# Patient Record
Sex: Male | Born: 1955 | Race: White | Hispanic: No | Marital: Single | Smoking: Never smoker
Health system: Southern US, Community
[De-identification: ages and names within clinical notes are randomized; demographics above are authoritative.]

## PROBLEM LIST (undated history)

## (undated) DIAGNOSIS — E7800 Pure hypercholesterolemia, unspecified: Secondary | ICD-10-CM

## (undated) HISTORY — PX: KNEE SURGERY: SHX244

## (undated) HISTORY — PX: HERNIA REPAIR: SHX51

---

## 2016-08-16 DIAGNOSIS — N529 Male erectile dysfunction, unspecified: Secondary | ICD-10-CM | POA: Insufficient documentation

## 2016-08-16 DIAGNOSIS — K648 Other hemorrhoids: Secondary | ICD-10-CM | POA: Insufficient documentation

## 2016-08-16 DIAGNOSIS — N400 Enlarged prostate without lower urinary tract symptoms: Secondary | ICD-10-CM | POA: Insufficient documentation

## 2016-08-16 DIAGNOSIS — J309 Allergic rhinitis, unspecified: Secondary | ICD-10-CM | POA: Insufficient documentation

## 2016-08-16 DIAGNOSIS — I10 Essential (primary) hypertension: Secondary | ICD-10-CM | POA: Insufficient documentation

## 2016-08-16 DIAGNOSIS — K219 Gastro-esophageal reflux disease without esophagitis: Secondary | ICD-10-CM | POA: Insufficient documentation

## 2017-09-26 ENCOUNTER — Emergency Department (HOSPITAL_BASED_OUTPATIENT_CLINIC_OR_DEPARTMENT_OTHER)
Admission: EM | Admit: 2017-09-26 | Discharge: 2017-09-26 | Disposition: A | Discharge status: Home / Self Care | Payer: BLUE CROSS/BLUE SHIELD | Attending: Emergency Medicine | Admitting: Emergency Medicine

## 2017-09-26 ENCOUNTER — Encounter (HOSPITAL_BASED_OUTPATIENT_CLINIC_OR_DEPARTMENT_OTHER): Payer: Self-pay | Admitting: Emergency Medicine

## 2017-09-26 ENCOUNTER — Emergency Department (HOSPITAL_BASED_OUTPATIENT_CLINIC_OR_DEPARTMENT_OTHER): Payer: BLUE CROSS/BLUE SHIELD

## 2017-09-26 DIAGNOSIS — M25512 Pain in left shoulder: Secondary | ICD-10-CM | POA: Diagnosis not present

## 2017-09-26 HISTORY — DX: Pure hypercholesterolemia, unspecified: E78.00

## 2017-09-26 MED ORDER — LIDOCAINE HCL 2 % EX GEL
1.0000 | Freq: Four times a day (QID) | CUTANEOUS | 0 refills | Status: AC | PRN
Start: 2017-09-26 — End: ?

## 2017-09-26 MED ORDER — MELOXICAM 15 MG PO TABS: 15.0000 mg | ORAL_TABLET | Freq: Every day | ORAL | 0 refills | Status: AC

## 2017-09-26 MED ORDER — PREDNISONE 20 MG PO TABS: 40.0000 mg | ORAL_TABLET | Freq: Every day | ORAL | 0 refills | Status: DC

## 2017-09-26 MED ORDER — KETOROLAC TROMETHAMINE 60 MG/2ML IM SOLN
60.0000 mg | Freq: Once | INTRAMUSCULAR | Status: AC
  Administered 2017-09-26: 60 mg via INTRAMUSCULAR
  Filled 2017-09-26: qty 2

## 2017-09-26 MED FILL — MELOXICAM 15 MG TABLET: 15 | 14 days supply | Qty: 14 | Fill #0

## 2017-09-26 MED FILL — predniSONE 20 MG TABS: 20 | 5 days supply | Qty: 10 | Fill #0

## 2017-09-26 MED FILL — LIDOCAINE HCL 2% JELLY: 2 | 10 days supply | Qty: 30 | Fill #0

## 2017-09-26 NOTE — ED Notes (Signed)
Patient states that he woke up yesterday morning with pain to his left shoulder blade and down his left arm. Patient reports that it fells better when he moves it upwards. The patient denies any Numbness or tingling, denies any Chest pain or SOB

## 2017-09-26 NOTE — ED Provider Notes (Signed)
[facility] EMERGENCY DEPARTMENT Provider Note   CSN: [id number] Arrival date & time: 09/26/17  [id number]     History   Chief Complaint Chief Complaint  Patient presents with  . Shoulder Pain    HPI Andrew Garcia is a 61 y.o. male with a history of hypercholesteremia presenting with sudden onset left shoulder pain that began yesterday.  He describes the pain as "feeling like a toothache" that radiates down the left arm to his fingers.  He reports the pain is improved with raising the left arm over his head.  He treated his symptoms with Flexeril last night without improvement. No known trauma or injury.  No previous left shoulder surgeries or injuries.  No left elbow or wrist pain.  He denies neck pain or stiffness, chest pain, shortness of breath, fever, chills, or palpitations.  The history is provided by the patient and the spouse. No language interpreter was used.  Shoulder Pain   This is a new problem. The current episode started yesterday. The problem occurs constantly. The problem has not changed since onset.The pain is present in the left shoulder. The pain is at a severity of 10/10. The pain is severe. Pertinent negatives include no numbness, full range of motion, no stiffness, no tingling and no itching. The symptoms are aggravated by lying down. He has tried OTC pain medications for the symptoms. There has been no history of extremity trauma. Family history is significant for no rheumatoid arthritis and no gout.    Past Medical History:  Diagnosis Date  . High cholesterol     There are no active problems to display for this patient.   History reviewed. No pertinent surgical history.     Home Medications    Prior to Admission medications   Medication Sig Start Date End Date Taking? Authorizing Provider  lidocaine (XYLOCAINE) 2 % jelly Apply 1 application topically 4 (four) times daily as needed. 09/26/17   Demyan Fugate A, PA-C  meloxicam (MOBIC) 15 MG  tablet Take 1 tablet (15 mg total) by mouth daily. 09/26/17   Parker Sawatzky A, PA-C  predniSONE (DELTASONE) 20 MG tablet Take 2 tablets (40 mg total) by mouth daily with breakfast. 09/26/17 10/01/17  Melady Chow, Coral ElseMia A, PA-C    Family History History reviewed. No pertinent family history.  Social History Social History  Substance Use Topics  . Smoking status: Never Smoker  . Smokeless tobacco: Never Used  . Alcohol use Yes     Comment: occ     Allergies   Patient has no known allergies.   Review of Systems Review of Systems  Constitutional: Negative for activity change, chills and fever.  Respiratory: Negative for chest tightness and shortness of breath.   Cardiovascular: Negative for chest pain and palpitations.  Gastrointestinal: Negative for abdominal pain.  Musculoskeletal: Positive for arthralgias, back pain and myalgias. Negative for joint swelling, neck pain, neck stiffness and stiffness.  Skin: Negative for itching and rash.  Neurological: Negative for tingling, weakness and numbness.     Physical Exam Updated Vital Signs BP (!) 140/100 (BP Location: Right Arm)   Pulse 77   Temp 97.8 F (36.6 C) (Oral)   Resp 18   Ht 5\' 9"  (1.753 m)   Wt 108.9 kg (240 lb)   SpO2 99%   BMI 35.44 kg/m   Physical Exam  Constitutional: He appears well-developed.  HENT:  Head: Normocephalic.  Eyes: Conjunctivae are normal.  Neck: Neck supple.  Cardiovascular: Normal rate and regular  rhythm.   No murmur heard. Pulmonary/Chest: Effort normal.  Abdominal: Soft. He exhibits no distension.  Musculoskeletal: Normal range of motion. He exhibits tenderness. He exhibits no deformity.  Tender to palpation along the medial border of the left scapula.  No tenderness to palpation of the superior or inferior border.  Full active and passive range of motion of the left shoulder without difficulty.  Negative empty can test. Negative Apley scratch test. Negative Hawkins test.   Sensation is  intact throughout the bilateral upper extremities.  Radial pulses are 2+ and symmetric.  5 out of 5 grip strength of the bilateral upper extremities.  5 out of 5 strength against resistance of the large muscle groups of the bilateral upper extremities.  No overlying erythema or warmth to the left shoulder or upper back.  Neurological: He is alert.  Skin: Skin is warm and dry.  Psychiatric: His behavior is normal.  Nursing note and vitals reviewed.    ED Treatments / Results  Labs (all labs ordered are listed, but only abnormal results are displayed) Labs Reviewed - No data to display  EKG  EKG Interpretation None       Radiology Dg Shoulder Left  Result Date: 09/26/2017 CLINICAL DATA:  Left shoulder pain.  No known injury. EXAM: LEFT SHOULDER - 2+ VIEW COMPARISON:  None. FINDINGS: There is no evidence of fracture or dislocation. There is no evidence of arthropathy or other focal bone abnormality. Soft tissues are unremarkable. IMPRESSION: Negative. Electronically Signed   By: Charlett Nose M.D.   On: 09/26/2017 09:40    Procedures Procedures (including critical care time)  Medications Ordered in ED Medications  ketorolac (TORADOL) injection 60 mg (60 mg Intramuscular Given 09/26/17 0940)     Initial Impression / Assessment and Plan / ED Course  I have reviewed the triage vital signs and the nursing notes.  Pertinent labs & imaging results that were available during my care of the patient were reviewed by me and considered in my medical decision making (see chart for details).     61 year old male presenting with left shoulder pain that began yesterday. EKG with no acute changes.  Left shoulder x-ray with out acute changes of fracture or dislocation.  No bony lesions.  On physical exam, the patient has tenderness to palpation along the left medial border of the scapula.  Full active and passive range of motion.  NVI.  Doubt rotator cuff tear since negative empty can test,  aortic dissection, gout, or septic joint. we will discharge the patient home with a burst of steroids for acute pain and symptomatic treatment.  Encourage follow-up with his PCP if his symptoms do not improve in the next 5-7 days.  Strict return precautions given.  No acute distress.  The patient is safe for discharge at this time.  Final Clinical Impressions(s) / ED Diagnoses   Final diagnoses:  Acute pain of left shoulder    New Prescriptions Discharge Medication List as of 09/26/2017 10:28 AM    START taking these medications   Details  lidocaine (XYLOCAINE) 2 % jelly Apply 1 application topically 4 (four) times daily as needed., Starting Mon 09/26/2017, Print    meloxicam (MOBIC) 15 MG tablet Take 1 tablet (15 mg total) by mouth daily., Starting Mon 09/26/2017, Print    predniSONE (DELTASONE) 20 MG tablet Take 2 tablets (40 mg total) by mouth daily with breakfast., Starting Mon 09/26/2017, Until Sat 10/01/2017, Print         Dewie Ahart,  Shylee Durrett A, PA-C 09/28/17 [id number]    Alvira Monday, MD 09/29/17 1258

## 2017-09-26 NOTE — Discharge Instructions (Signed)
Take 40 mg (2 tablets) of prednisone daily with breakfast for the next 5 days.  Apply a thin film of lidocaine gel to areas that are sore up to 4 times daily.  Take 1 tablet of meloxicam daily with food to help with pain and inflammation.  Please do not take other anti-inflammatories such as ibuprofen, Motrin, or naproxen while taking this medication.  Begin performing stretching exercises as your pain allows.  If your pain does not improve with this regimen in the next 5 days, please call Dr. Lazaro ArmsHudnall's office to schedule follow-up appointment.  If you develop new symptoms such as weakness in the left arm, chest pain, shortness of breath, or other new concerning symptoms, please return to the emergency department for reevaluation.

## 2017-09-29 ENCOUNTER — Encounter: Payer: Self-pay | Admitting: Family Medicine

## 2017-09-29 ENCOUNTER — Ambulatory Visit (INDEPENDENT_AMBULATORY_CARE_PROVIDER_SITE_OTHER): Payer: BLUE CROSS/BLUE SHIELD | Admitting: Family Medicine

## 2017-09-29 DIAGNOSIS — M5010 Cervical disc disorder with radiculopathy, unspecified cervical region: Secondary | ICD-10-CM

## 2017-09-29 MED ORDER — PREDNISONE 10 MG PO TABS: ORAL_TABLET | ORAL | 0 refills | Status: AC

## 2017-09-29 MED ORDER — HYDROCODONE-ACETAMINOPHEN 5-325 MG PO TABS: 1.0000 | ORAL_TABLET | Freq: Four times a day (QID) | ORAL | 0 refills | Status: AC | PRN

## 2017-09-29 MED ORDER — TIZANIDINE HCL 4 MG PO TABS: 4.0000 mg | ORAL_TABLET | Freq: Three times a day (TID) | ORAL | 1 refills | Status: AC | PRN

## 2017-09-29 MED FILL — predniSONE 10 MG TABS: 10 | 7 days supply | Qty: 21 | Fill #0

## 2017-09-29 MED FILL — HYDROCODON-APAP 5-325: 5-325 | 5 days supply | Qty: 20 | Fill #0

## 2017-09-29 MED FILL — tiZANidine HCL 4 MG TABS: 4 | 20 days supply | Qty: 60 | Fill #0

## 2017-09-29 NOTE — Patient Instructions (Signed)
You have cervical radiculopathy (a pinched nerve in the neck). Prednisone 6 day dose pack to relieve irritation/inflammation of the nerve - finish the prednisone from the ER then start taking this. Don't take meloxicam, aleve or ibuprofen while on prednisone Tizanidine three times a day as needed for muscle spasms (do not drive with this if it makes you sleepy). Norco as needed for severe pain (no driving on this medicine). Consider cervical collar if severely painful. Simple range of motion exercises within limits of pain to prevent further stiffness. Consider physical therapy for stretching, exercises, traction, and modalities. Heat 15 minutes at a time 3-4 times a day to help with spasms. Watch head position when on computers, texting, when sleeping in bed - should in line with back to prevent further nerve traction and irritation. Consider home traction unit if you get benefit with this in physical therapy. If not improving we will consider an MRI. Call me in a week to let me know how you're doing.

## 2017-10-02 ENCOUNTER — Encounter: Payer: Self-pay | Admitting: Family Medicine

## 2017-10-02 DIAGNOSIS — M5010 Cervical disc disorder with radiculopathy, unspecified cervical region: Secondary | ICD-10-CM | POA: Insufficient documentation

## 2017-10-02 NOTE — Assessment & Plan Note (Signed)
distribution consistent with low cervical radiculopathy, likely C8.  Start with prednisone dose pack.  Tizanidine and norco as needed.  Heat for spasms. Discussed ergonomic issues.  Let us know how he's doing in a week.  If not improving consider MRI, if improving consider physical therapy.

## 2017-10-02 NOTE — Progress Notes (Addendum)
PCP: Andreas Blower., MD  Subjective:   HPI: Patient is a 61 y.o. male here for left shoulder/neck pain.  Patient denies known injury or trauma. He states on 10/28 he started to get what felt like a tight muscle left side of neck, worsened especially over next day. Pain level 5/10 but up to 10/10 and sharp at times. assocaited numbness into left hand, 3rd-5th digits. Taking meloxicam from ED. Difficulty getting comfortable. Wakes him up at night. No bowel/bladder dysfunction. No skin changes.  Past Medical History:  Diagnosis Date  . High cholesterol     Current Outpatient Medications on File Prior to Visit  Medication Sig Dispense Refill  . atorvastatin (LIPITOR) 10 MG tablet Take 10 mg by mouth.    Marland Kitchen omeprazole (PRILOSEC) 10 MG capsule Take 10 mg by mouth.    . lidocaine (XYLOCAINE) 2 % jelly Apply 1 application topically 4 (four) times daily as needed. 30 mL 0  . meloxicam (MOBIC) 15 MG tablet Take 1 tablet (15 mg total) by mouth daily. 14 tablet 0   No current facility-administered medications on file prior to visit.     History reviewed. No pertinent surgical history.  No Known Allergies  Social History   Socioeconomic History  . Marital status: Married    Spouse name: Not on file  . Number of children: Not on file  . Years of education: Not on file  . Highest education level: Not on file  Social Needs  . Financial resource strain: Not on file  . Food insecurity - worry: Not on file  . Food insecurity - inability: Not on file  . Transportation needs - medical: Not on file  . Transportation needs - non-medical: Not on file  Occupational History  . Not on file  Tobacco Use  . Smoking status: Never Smoker  . Smokeless tobacco: Never Used  Substance and Sexual Activity  . Alcohol use: Yes    Comment: occ  . Drug use: No  . Sexual activity: Not on file  Other Topics Concern  . Not on file  Social History Narrative  . Not on file    History reviewed.  No pertinent family history.  BP (!) 143/90   Pulse (!) 102   Ht 5\' 9"  (1.753 m)   Wt 240 lb (108.9 kg)   BMI 35.44 kg/m   Review of Systems: See HPI above.     Objective:  Physical Exam:  Gen: NAD, comfortable in exam room  Neck: No gross deformity, swelling, bruising. TTP left cervical paraspinal region.  No midline/bony TTP. FROM. BUE strength 5/5.   Sensation diminished to light touch left 5th digit.   1+ equal reflexes in triceps, biceps, brachioradialis tendons. Negative spurlings.  Left shoulder: No swelling, ecchymoses.  No gross deformity. No TTP. FROM. Strength 5/5 with empty can and resisted internal/external rotation. NV intact distally.  Right shoulder: No swelling, ecchymoses.  No gross deformity. No TTP. FROM. Strength 5/5 with empty can and resisted internal/external rotation. NV intact distally.   Assessment & Plan:  1. Cervical radiculopathy - distribution consistent with low cervical radiculopathy, likely C8.  Start with prednisone dose pack.  Tizanidine and norco as needed.  Heat for spasms. Discussed ergonomic issues.  Let us know how he's doing in a week.  If not improving consider MRI, if improving consider physical therapy.  Addendum:  MRI reviewed and discussed with patient.  He does have disc extrusion on left at C7-T1.  We discussed options  and he would like to go ahead with ESI at this level - advised to call us a week after this to let us know how he's doing.

## 2017-10-10 ENCOUNTER — Telehealth: Payer: Self-pay | Admitting: Family Medicine

## 2017-10-10 NOTE — Telephone Encounter (Signed)
Ok to go ahead with cervical spine MRI.  He hasn't had x-rays yet if his insurance requires them.  Thanks!

## 2017-10-10 NOTE — Telephone Encounter (Signed)
Patient's wife calling of behalf of patient. States he is not getting better and would like to go forward with the MRI.   They do not have a specific place in mind but somewhere in [place] would be ideal.

## 2017-10-10 NOTE — Addendum Note (Signed)
Addended by: Kathi SimpersWISE, Yavonne Kiss F on: 10/10/2017 04:27 PM   Modules accepted: Orders

## 2017-10-11 NOTE — Telephone Encounter (Signed)
[facility] 10/12/17

## 2017-10-13 ENCOUNTER — Encounter: Payer: Self-pay | Admitting: Family Medicine

## 2017-10-17 ENCOUNTER — Other Ambulatory Visit: Payer: Self-pay | Admitting: Family Medicine

## 2017-10-17 DIAGNOSIS — M5412 Radiculopathy, cervical region: Secondary | ICD-10-CM

## 2017-10-25 ENCOUNTER — Ambulatory Visit
Admission: RE | Admit: 2017-10-25 | Discharge: 2017-10-25 | Disposition: A | Discharge status: Home / Self Care | Payer: BLUE CROSS/BLUE SHIELD | Source: Ambulatory Visit | Attending: Family Medicine | Admitting: Family Medicine

## 2017-10-25 DIAGNOSIS — M5412 Radiculopathy, cervical region: Secondary | ICD-10-CM

## 2017-10-25 MED ORDER — TRIAMCINOLONE ACETONIDE 40 MG/ML IJ SUSP (RADIOLOGY)
60.0000 mg | Freq: Once | INTRAMUSCULAR | Status: AC
  Administered 2017-10-25: 60 mg via EPIDURAL

## 2017-10-25 MED ORDER — IOPAMIDOL (ISOVUE-M 300) INJECTION 61%
1.0000 mL | Freq: Once | INTRAMUSCULAR | Status: AC | PRN
  Administered 2017-10-25: 1 mL via EPIDURAL

## 2017-10-25 NOTE — Discharge Instructions (Signed)

## 2019-01-01 IMAGING — XA DG INJECT/[PERSON_NAME] INC NEEDLE/CATH/PLC EPI/CERV/THOR W/IMG
2 series · 2 of 2 positions shown · non-contrast
Comparison: none

CLINICAL DATA: Left arm pain, numbness and weakness. Spondylosis
without myelopathy.

[Series 1: ortho standard · 1 of 1 slices shown (1 of 2)]
[im 1/1]
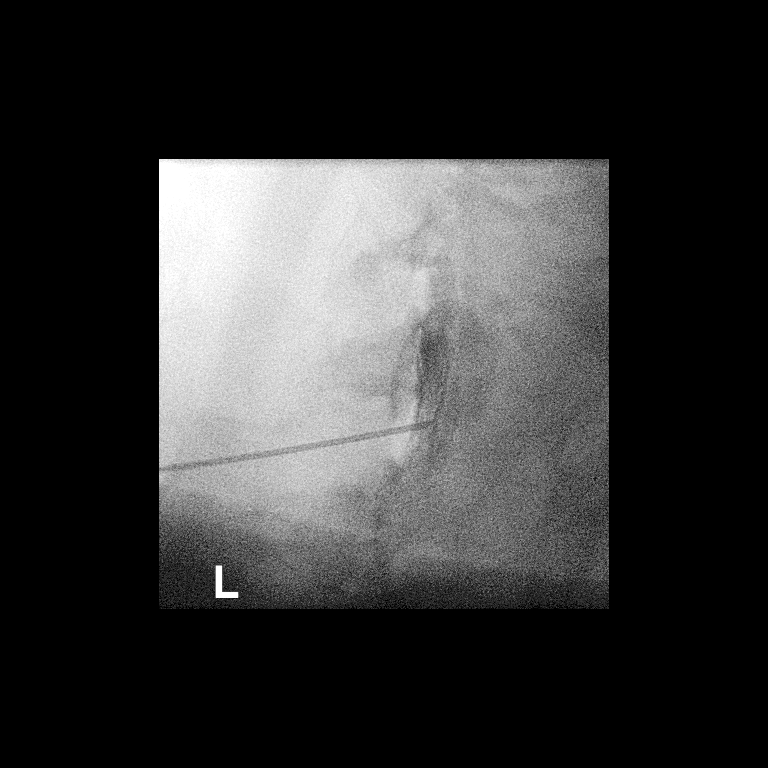

[Series 2: ortho standard · 1 of 1 slices shown (2 of 2)]
[im 1/1]
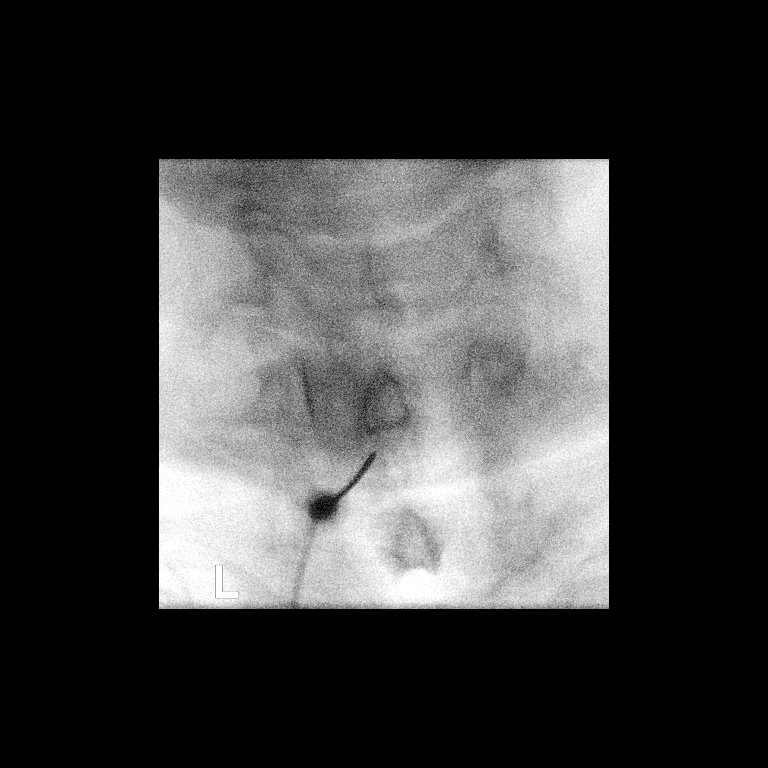

[2 of 2 positions shown; findings below may reference images not displayed]

FLUOROSCOPY TIME:  0 minutes 32 seconds. 15.97 micro gray meter
squared

PROCEDURE:
CERVICAL EPIDURAL INJECTION

An interlaminar approach was performed on the left at C7 . A 20
gauge epidural needle was advanced using loss-of-resistance
technique.

DIAGNOSTIC EPIDURAL INJECTION

Injection of Isovue-M 300 shows a good epidural pattern with spread
above and below the level of needle placement, primarily on the
left. No vascular opacification is seen. THERAPEUTIC

EPIDURAL INJECTION

1.5 ml of Kenalog 40 mixed with 1 ml of 1% Lidocaine and 2 ml of
normal saline were then instilled. The procedure was well-tolerated,
and the patient was discharged thirty minutes following the
injection in good condition.
IMPRESSION: Technically successful initial epidural injection on the left at C7.

## 2021-06-23 ENCOUNTER — Emergency Department (HOSPITAL_BASED_OUTPATIENT_CLINIC_OR_DEPARTMENT_OTHER)
Admission: EM | Admit: 2021-06-23 | Discharge: 2021-06-23 | Disposition: A | Discharge status: Home / Self Care | Payer: Medicare Other | Attending: Emergency Medicine | Admitting: Emergency Medicine

## 2021-06-23 ENCOUNTER — Encounter (HOSPITAL_BASED_OUTPATIENT_CLINIC_OR_DEPARTMENT_OTHER): Payer: Self-pay

## 2021-06-23 ENCOUNTER — Other Ambulatory Visit: Payer: Self-pay

## 2021-06-23 DIAGNOSIS — M25511 Pain in right shoulder: Secondary | ICD-10-CM | POA: Insufficient documentation

## 2021-06-23 DIAGNOSIS — I10 Essential (primary) hypertension: Secondary | ICD-10-CM | POA: Diagnosis not present

## 2021-06-23 DIAGNOSIS — Z79899 Other long term (current) drug therapy: Secondary | ICD-10-CM | POA: Diagnosis not present

## 2021-06-23 MED ORDER — HYDROCODONE-ACETAMINOPHEN 5-325 MG PO TABS: 1.0000 | ORAL_TABLET | Freq: Four times a day (QID) | ORAL | 0 refills | Status: DC | PRN

## 2021-06-23 MED ORDER — METHOCARBAMOL 750 MG PO TABS: 750.0000 mg | ORAL_TABLET | Freq: Three times a day (TID) | ORAL | 0 refills | Status: AC | PRN

## 2021-06-23 MED ORDER — METHYLPREDNISOLONE 4 MG PO TBPK: ORAL_TABLET | ORAL | 0 refills | Status: AC

## 2021-06-23 MED ORDER — HYDROCODONE-ACETAMINOPHEN 5-325 MG PO TABS: 1.0000 | ORAL_TABLET | Freq: Four times a day (QID) | ORAL | 0 refills | Status: AC | PRN

## 2021-06-23 NOTE — ED Provider Notes (Signed)
[facility] EMERGENCY DEPARTMENT Provider Note   CSN: [id number] Arrival date & time: 06/23/21  1027     History Chief Complaint  Patient presents with   Shoulder Pain    Andrew Garcia is a 65 y.o. male.  HPI Patient reports that yesterday he was in the shower.  He reached to adjust the showerhead with his right upper extremity.  He reports he got a sudden sharp pain behind his shoulder blade.  He reports since that time there is been a deep intense ache behind the upper portion of his shoulder blade on the right.  He reports that he was very uncomfortable overnight.  His position of comfort is with the arm over his head and the hand behind his head.  He reports he drove over here with his arm elevated like that.  He tried several doses of ibuprofen and put an ice pack on the area which was helpful but he continues to have aching pain behind the shoulder blade that goes into his shoulder and arm like a tooth ache.  He reports that it also sometimes radiates down to the aspect of his ulnar hand and pinky.  In that area he can get kind of tingly sensation.  That aspect is coming and going.  No associated chest pain.  No shortness of breath.  No lightheadedness no near syncope.  Patient reports he has been active outside, he mows his lawn and does physical activity without any shortness of breath or chest pain.  Patient reports his daughter with concern about his heart due to severe stress at home.  Patient is caring for his wife who is very ill and dying.    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 No pertinent family history.  Social History   Tobacco Use   Smoking status: Never   Smokeless tobacco: Never  Vaping Use   Vaping Use: Never used  Substance Use Topics   Alcohol use: Yes    Comment: occ   Drug use: No    Home Medications Prior to Admission medications   Medication Sig Start Date End Date Taking? Authorizing Provider  HYDROcodone-acetaminophen (NORCO/VICODIN) 5-325 MG tablet Take 1-2 tablets by mouth every 6 (six) hours as needed for moderate pain or severe pain. 06/23/21  Yes Arby Barrette, MD  methocarbamol (ROBAXIN-750) 750 MG tablet Take 1 tablet (750 mg total) by mouth every 8 (eight) hours as needed for muscle spasms. 06/23/21  Yes Arby Barrette, MD  methylPREDNISolone (MEDROL DOSEPAK) 4 MG TBPK tablet Per Dosepak instructions 06/23/21  Yes Burdell Peed, Lebron Conners, MD  amLODipine (NORVASC) 2.5 MG tablet Take 2.5 mg by mouth daily. 06/17/21   [provider]  atorvastatin (LIPITOR) 10 MG tablet Take 10 mg by mouth. 08/25/17   [provider]  fluticasone (FLONASE) 50 MCG/ACT nasal spray Place 1 spray into both nostrils daily. 02/09/21   [provider]  HYDROcodone-acetaminophen (NORCO) 5-325 MG  tablet Take 1 tablet by mouth every 6 (six) hours as needed for moderate pain. 09/29/17   Hudnall, Azucena Fallen, MD  lidocaine (XYLOCAINE) 2 % jelly Apply 1 application topically 4 (four) times daily as needed. 09/26/17   McDonald, Mia A, PA-C  meloxicam (MOBIC) 15 MG tablet Take 1 tablet (15 mg total) by mouth daily. 09/26/17   McDonald, Mia A, PA-C  omeprazole (PRILOSEC) 10 MG capsule Take 10 mg by mouth. 08/22/17   [provider]  predniSONE (DELTASONE) 10 MG tablet 6 tabs po day 1, 5 tabs po day 2, 4 tabs po day 3, 3 tabs po day 4, 2 tabs po day 5, 1 tab po day 6 09/29/17    Hudnall, Azucena Fallen, MD  tamsulosin (FLOMAX) 0.4 MG CAPS capsule Take 0.4 mg by mouth at bedtime. 05/04/21   [provider]  tiZANidine (ZANAFLEX) 4 MG tablet Take 1 tablet (4 mg total) by mouth every 8 (eight) hours as needed for muscle spasms. 09/29/17   Lenda Kelp, MD    Allergies    Patient has no known allergies.  Review of Systems   Review of Systems Constitutional: No fever no chills no malaise Respiratory: No cough no shortness of breath Cardiovascular: No chest pain no lightheadedness no near syncope no lower extremity swelling or calf pain. Physical Exam Updated Vital Signs BP (!) 147/95 (BP Location: Right Arm)   Pulse 80   Temp 97.7 F (36.5 C) (Oral)   Resp 18   Ht 5\' 9"  (1.753 m)   Wt 104.3 kg   SpO2 100%   BMI 33.97 kg/m   Physical Exam Constitutional:      Appearance: Normal appearance.  HENT:     Head: Normocephalic and atraumatic.     Mouth/Throat:     Pharynx: Oropharynx is clear.  Eyes:     Extraocular Movements: Extraocular movements intact.  Cardiovascular:     Rate and Rhythm: Normal rate and regular rhythm.  Pulmonary:     Effort: Pulmonary effort is normal.     Breath sounds: Normal breath sounds.  Abdominal:     General: There is no distension.     Palpations: Abdomen is soft.     Tenderness: There is no abdominal tenderness. There is no guarding.  Musculoskeletal:     Cervical back: Neck supple.     Comments: Patient has very focal point of reproducible pain at the medial aspect of the upper scapula.  He reports that area is burning and very tender to palpation.  He does have intact range of motion of the arm and the shoulder.  Position of comfort is with the patient's arm over his head and hand behind his back.  Full range of motion however intact.  Radial pulse 2+ and strong.  Grip strength strong.  Extremity warm and dry without edema.  Lower extremities no edema or calf tenderness.  Skin:    General: Skin is warm and dry.   Neurological:     General: No focal deficit present.     Mental Status: He is alert and oriented to person, place, and time.     Coordination: Coordination normal.  Psychiatric:     Comments: Patient's mood is interactive and appropriate.  He is not withdrawn.  He did become tearful when speaking of the situation with his wife who is ill.  He did recover and has normal demeanor and interactions.    ED Results / Procedures / Treatments   Labs (  all labs ordered are listed, but only abnormal results are displayed) Labs Reviewed - No data to display  EKG None  Radiology No results found.  Procedures Procedures   Medications Ordered in ED Medications - No data to display  ED Course  I have reviewed the triage vital signs and the nursing notes.  Pertinent labs & imaging results that were available during my care of the patient were reviewed by me and considered in my medical decision making (see chart for details).    MDM Rules/Calculators/A&P                           Patient presents as outlined.  He had an acute incident of raising his arm over his head yesterday that precipitated the event.  He has a focal area of reproducible pain at the superior aspect of the scapula.  Pain is relieved by overhead arm position.  He is getting a slight radiculopathy but has completely normal function. at this time, no suggestion of cardiac ischemic etiology.  Patient describes normal exercise tolerance without any anginal symptoms.  Patient had a very similar event several years ago that responded to steroid injections.  At this time we will start on a Medrol Dosepak, Robaxin and Vicodin as needed.  Follow-up plan and return precautions reviewed. Final Clinical Impression(s) / ED Diagnoses Final diagnoses:  Acute pain of right shoulder  Right subscapular pain    Rx / DC Orders ED Discharge Orders          Ordered    methylPREDNISolone (MEDROL DOSEPAK) 4 MG TBPK tablet        06/23/21  0957    methocarbamol (ROBAXIN-750) 750 MG tablet  Every 8 hours PRN        06/23/21 0957    HYDROcodone-acetaminophen (NORCO/VICODIN) 5-325 MG tablet  Every 6 hours PRN        06/23/21 7654             Arby Barrette, MD 06/23/21 1011

## 2021-06-23 NOTE — Discharge Instructions (Addendum)
1.  At this time your symptoms are very suggestive of a pinched nerve behind your shoulder blade.  You may have also strained or torn some surrounding muscles. 2.  Take the Medrol Dosepak as prescribed.  You should see some improvement in 12 to 24 hours with the steroid.  Take the Robaxin for a muscle relaxer.  You may take Vicodin every 6 hours as prescribed.  You may take 1/2 tablet if you feel this is adequate for your pain control.  Continue to apply ice packs to the area. 3.  Schedule follow-up appoint with your doctor.  You may need referral to sports medicine or orthopedic doctor if symptoms are persisting. 4.  Return to the emergency department if you develop any chest pain, shortness of breath, feeling lightheaded or other concerning symptoms.

## 2021-06-23 NOTE — ED Triage Notes (Signed)
Pt states was in the shower yesterday and reached up to shower head and felt a sudden sharp pain to left shoulder blade. Now continues to have pain to shoulder and down left arm. Pain is relieved when raising arm.

## 2021-06-23 NOTE — ED Notes (Signed)
Patient with left shoulder pain after reaching up last night to adjust shower head, pain relieved by lifting his arm and placing hand on back of neck, reports similar issues a few years ago related to a pinched nerve.
